# Patient Record
Sex: Male | Born: 2000 | Race: White | Hispanic: Yes | Marital: Single | Smoking: Never smoker
Health system: Southern US, Community
[De-identification: ages and names within clinical notes are randomized; demographics above are authoritative.]

---

## 2019-07-18 ENCOUNTER — Emergency Department (HOSPITAL_COMMUNITY): Payer: BLUE CROSS/BLUE SHIELD

## 2019-07-18 ENCOUNTER — Other Ambulatory Visit: Payer: Self-pay

## 2019-07-18 DIAGNOSIS — R05 Cough: Secondary | ICD-10-CM | POA: Diagnosis not present

## 2019-07-18 DIAGNOSIS — R519 Headache, unspecified: Secondary | ICD-10-CM | POA: Diagnosis not present

## 2019-07-18 DIAGNOSIS — R110 Nausea: Secondary | ICD-10-CM | POA: Insufficient documentation

## 2019-07-18 DIAGNOSIS — Z20828 Contact with and (suspected) exposure to other viral communicable diseases: Secondary | ICD-10-CM | POA: Insufficient documentation

## 2019-07-18 DIAGNOSIS — R079 Chest pain, unspecified: Secondary | ICD-10-CM | POA: Insufficient documentation

## 2019-07-18 DIAGNOSIS — R0602 Shortness of breath: Secondary | ICD-10-CM | POA: Diagnosis not present

## 2019-07-18 DIAGNOSIS — J029 Acute pharyngitis, unspecified: Secondary | ICD-10-CM | POA: Diagnosis not present

## 2019-07-18 DIAGNOSIS — M791 Myalgia, unspecified site: Secondary | ICD-10-CM | POA: Insufficient documentation

## 2019-07-18 DIAGNOSIS — R509 Fever, unspecified: Secondary | ICD-10-CM | POA: Diagnosis not present

## 2019-07-18 MED ORDER — SODIUM CHLORIDE 0.9% FLUSH: 3.0000 mL | Freq: Once | INTRAVENOUS | Status: DC

## 2019-07-19 ENCOUNTER — Emergency Department (HOSPITAL_COMMUNITY)
Admission: EM | Admit: 2019-07-19 | Discharge: 2019-07-19 | Disposition: A | Discharge status: Home / Self Care | Payer: BLUE CROSS/BLUE SHIELD | Attending: Emergency Medicine | Admitting: Emergency Medicine

## 2019-07-19 ENCOUNTER — Encounter (HOSPITAL_COMMUNITY): Payer: Self-pay | Admitting: Emergency Medicine

## 2019-07-19 ENCOUNTER — Other Ambulatory Visit: Payer: Self-pay

## 2019-07-19 DIAGNOSIS — Z20822 Contact with and (suspected) exposure to covid-19: Secondary | ICD-10-CM

## 2019-07-19 LAB — COMPREHENSIVE METABOLIC PANEL
ALT: 31 U/L (ref 0–44)
AST: 25 U/L (ref 15–41)
Albumin: 4.5 g/dL (ref 3.5–5.0)
Alkaline Phosphatase: 43 U/L (ref 38–126)
Anion gap: 11 (ref 5–15)
BUN: 15 mg/dL (ref 6–20)
CO2: 22 mmol/L (ref 22–32)
Calcium: 9.5 mg/dL (ref 8.9–10.3)
Chloride: 103 mmol/L (ref 98–111)
Creatinine, Ser: 1.05 mg/dL (ref 0.61–1.24)
GFR calc Af Amer: >60 mL/min (ref >60)
GFR calc non Af Amer: >60 mL/min (ref >60)
Glucose, Bld: 108 mg/dL — ABNORMAL HIGH (ref 70–99)
Potassium: 3.6 mmol/L (ref 3.5–5.1)
Sodium: 136 mmol/L (ref 135–145)
Total Bilirubin: 1.3 mg/dL — ABNORMAL HIGH (ref 0.3–1.2)
Total Protein: 7.8 g/dL (ref 6.5–8.1)

## 2019-07-19 LAB — URINALYSIS, ROUTINE W REFLEX MICROSCOPIC
Bilirubin Urine: NEGATIVE
Glucose, UA: NEGATIVE mg/dL
Hgb urine dipstick: NEGATIVE
Ketones, ur: NEGATIVE mg/dL
Leukocytes,Ua: NEGATIVE
Nitrite: NEGATIVE
Protein, ur: NEGATIVE mg/dL
Specific Gravity, Urine: 1.019 (ref 1.005–1.030)
pH: 7 (ref 5.0–8.0)

## 2019-07-19 LAB — CBC
HCT: 47.5 % (ref 39.0–52.0)
Hemoglobin: 15.3 g/dL (ref 13.0–17.0)
MCH: 27.5 pg (ref 26.0–34.0)
MCHC: 32.2 g/dL (ref 30.0–36.0)
MCV: 85.3 fL (ref 80.0–100.0)
Platelets: 228 10*3/uL (ref 150–400)
RBC: 5.57 MIL/uL (ref 4.22–5.81)
RDW: 12.5 % (ref 11.5–15.5)
WBC: 9.7 10*3/uL (ref 4.0–10.5)
nRBC: 0 % (ref 0.0–0.2)

## 2019-07-19 LAB — LIPASE, BLOOD: Lipase: 24 U/L (ref 11–51)

## 2019-07-19 LAB — SARS CORONAVIRUS 2 (TAT 6-24 HRS): SARS Coronavirus 2: NEGATIVE

## 2019-07-19 MED ORDER — ACETAMINOPHEN 500 MG PO TABS
1000.0000 mg | ORAL_TABLET | Freq: Once | ORAL | Status: AC
  Administered 2019-07-19: 1000 mg via ORAL
  Filled 2019-07-19: qty 2

## 2019-07-19 NOTE — Discharge Instructions (Addendum)
You may alternate Tylenol 1000 mg every 6 hours as needed for pain and fever and Ibuprofen 800 mg every 8 hours as needed for pain and fever.  Please take Ibuprofen with food.  You may follow-up on your COVID-19 test results through [facility].  Instructions are provided with this paperwork.  If you are positive, you will need to remain in quarantine for at least 10 full days after the onset of symptoms and your symptoms will need to be improving and you will need to be fever free for at least 3 full days without using Tylenol or ibuprofen before coming out of quarantine.  If your test results are positive, any recent close contacts that you have seen in the past 14 days since developing symptoms will need to be notified and will need to quarantine for 14 days themselves.  COVID-19 is a viral illness.  You do not need antibiotics at this time.  Your labs, chest x-ray, urine results today were normal.

## 2019-07-19 NOTE — ED Provider Notes (Signed)
TIME SEEN: 4:02 AM  CHIEF COMPLAINT: Fever, body aches, cough, chest pain, sore throat, headache  HPI: Patient is a 18 year old male with no significant past medical history who presents to the emergency department 1 day of fevers, body aches, dry cough, chest discomfort, sore throat, headache.  No vomiting or diarrhea.  No shortness of breath.  No known Covid exposures.  ROS: See HPI Constitutional:  fever  Eyes: no drainage  ENT: no runny nose   Cardiovascular:   chest pain  Resp: no SOB  GI: no vomiting GU: no dysuria Integumentary: no rash  Allergy: no hives  Musculoskeletal: no leg swelling  Neurological: no slurred speech ROS otherwise negative  PAST MEDICAL HISTORY/PAST SURGICAL HISTORY:  History reviewed. No pertinent past medical history.  MEDICATIONS:  Prior to Admission medications   Not on File    ALLERGIES:  No Known Allergies  SOCIAL HISTORY:  Social History   Tobacco Use  . Smoking status: Never Smoker  . Smokeless tobacco: Never Used  Substance Use Topics  . Alcohol use: Never    Frequency: Never    FAMILY HISTORY: History reviewed. No pertinent family history.  EXAM: BP 129/79 (BP Location: Left Arm)   Pulse 98   Temp (!) 101.9 F (38.8 C) (Oral)   Resp 20   Ht 5\' 9"  (1.753 m)   Wt 99.8 kg   SpO2 100%   BMI 32.49 kg/m  CONSTITUTIONAL: Alert and oriented and responds appropriately to questions. Well-appearing; well-nourished, well-hydrated, nontoxic-appearing HEAD: Normocephalic EYES: Conjunctivae clear, pupils appear equal, EOMI ENT: normal nose; moist mucous membranes; No pharyngeal erythema or petechiae, no tonsillar hypertrophy or exudate, no uvular deviation, no unilateral swelling, no trismus or drooling, no muffled voice, normal phonation, no stridor, no dental caries present, no drainable dental abscess noted, no Ludwig's angina, tongue sits flat in the bottom of the mouth, no angioedema, no facial erythema or warmth, no facial  swelling; no pain with movement of the neck. NECK: Supple, no meningismus, no nuchal rigidity, no LAD  CARD: RRR; S1 and S2 appreciated; no murmurs, no clicks, no rubs, no gallops RESP: Normal chest excursion without splinting or tachypnea; breath sounds clear and equal bilaterally; no wheezes, no rhonchi, no rales, no hypoxia or respiratory distress, speaking full sentences ABD/GI: Normal bowel sounds; non-distended; soft, non-tender, no rebound, no guarding, no peritoneal signs, no hepatosplenomegaly BACK:  The back appears normal and is non-tender to palpation, there is no CVA tenderness EXT: Normal ROM in all joints; non-tender to palpation; no edema; normal capillary refill; no cyanosis, no calf tenderness or swelling    SKIN: Normal color for age and race; warm; no rash NEURO: Moves all extremities equally PSYCH: The patient's mood and manner are appropriate. Grooming and personal hygiene are appropriate.  MEDICAL DECISION MAKING: Patient here with symptoms of viral illness.  Could be possible COVID-19.  He has no increased work of breathing, respiratory distress or hypoxia.  Labs, urine and chest x-ray obtained in triage are unremarkable.  Will provide with Tylenol for headache, body aches and fever.  Doubt meningitis, sepsis, bacteremia.  Will obtain Covid swab today.  Will also obtain EKG given chest discomfort but doubt ACS, PE, dissection.  ED PROGRESS: EKG shows nonspecific T wave changes in inferior leads with no old for comparison.  Doubt ACS.  Chest pain seems related to his viral illness.  I feel he is safe for discharge home.  Discussed supportive care instructions.  Discussed how to check his Covid  test results through MyChart.  Recommend quarantine for 10 days after the onset of symptoms and not to come out of quarantine until he has been fever free for 3 full days.  Recommended he advise any close contacts that he has a febrile illness.  Discussed return precautions.  He is  comfortable with plan for discharge home.   At this time, I do not feel there is any life-threatening condition present. I have reviewed, interpreted and discussed all results (EKG, imaging, lab, urine as appropriate) and exam findings with patient/family. I have reviewed nursing notes and appropriate previous records.  I feel the patient is safe to be discharged home without further emergent workup and can continue workup as an outpatient as needed. Discussed usual and customary return precautions. Patient/family verbalize understanding and are comfortable with this plan.  Outpatient follow-up has been provided as needed. All questions have been answered.    EKG Interpretation  Date/Time:  Tuesday July 19 2019 04:15:45 EST Ventricular Rate:  85 PR Interval:    QRS Duration: 95 QT Interval:  379 QTC Calculation: 451 R Axis:   99 Text Interpretation: Sinus rhythm Borderline right axis deviation Nonspecific repol abnormality, inferior leads No old tracing to compare Confirmed by Derica Leiber, Baxter Hire [phone]) on 07/19/2019 4:43:21 AM        Charlestine Night was evaluated in Emergency Department on 07/19/2019 for the symptoms described in the history of present illness. He was evaluated in the context of the global COVID-19 pandemic, which necessitated consideration that the patient might be at risk for infection with the SARS-CoV-2 virus that causes COVID-19. Institutional protocols and algorithms that pertain to the evaluation of patients at risk for COVID-19 are in a state of rapid change based on information released by regulatory bodies including the CDC and federal and state organizations. These policies and algorithms were followed during the patient's care in the ED.    Evadna Donaghy, Layla Maw, DO 07/19/19 [phone]

## 2021-02-09 IMAGING — CR DG CHEST 2V
2 series · 2 of 2 positions shown · non-contrast
Comparison: None.

CLINICAL DATA: Shortness of breath

EXAM:
CHEST - 2 VIEW

[w chest pa]
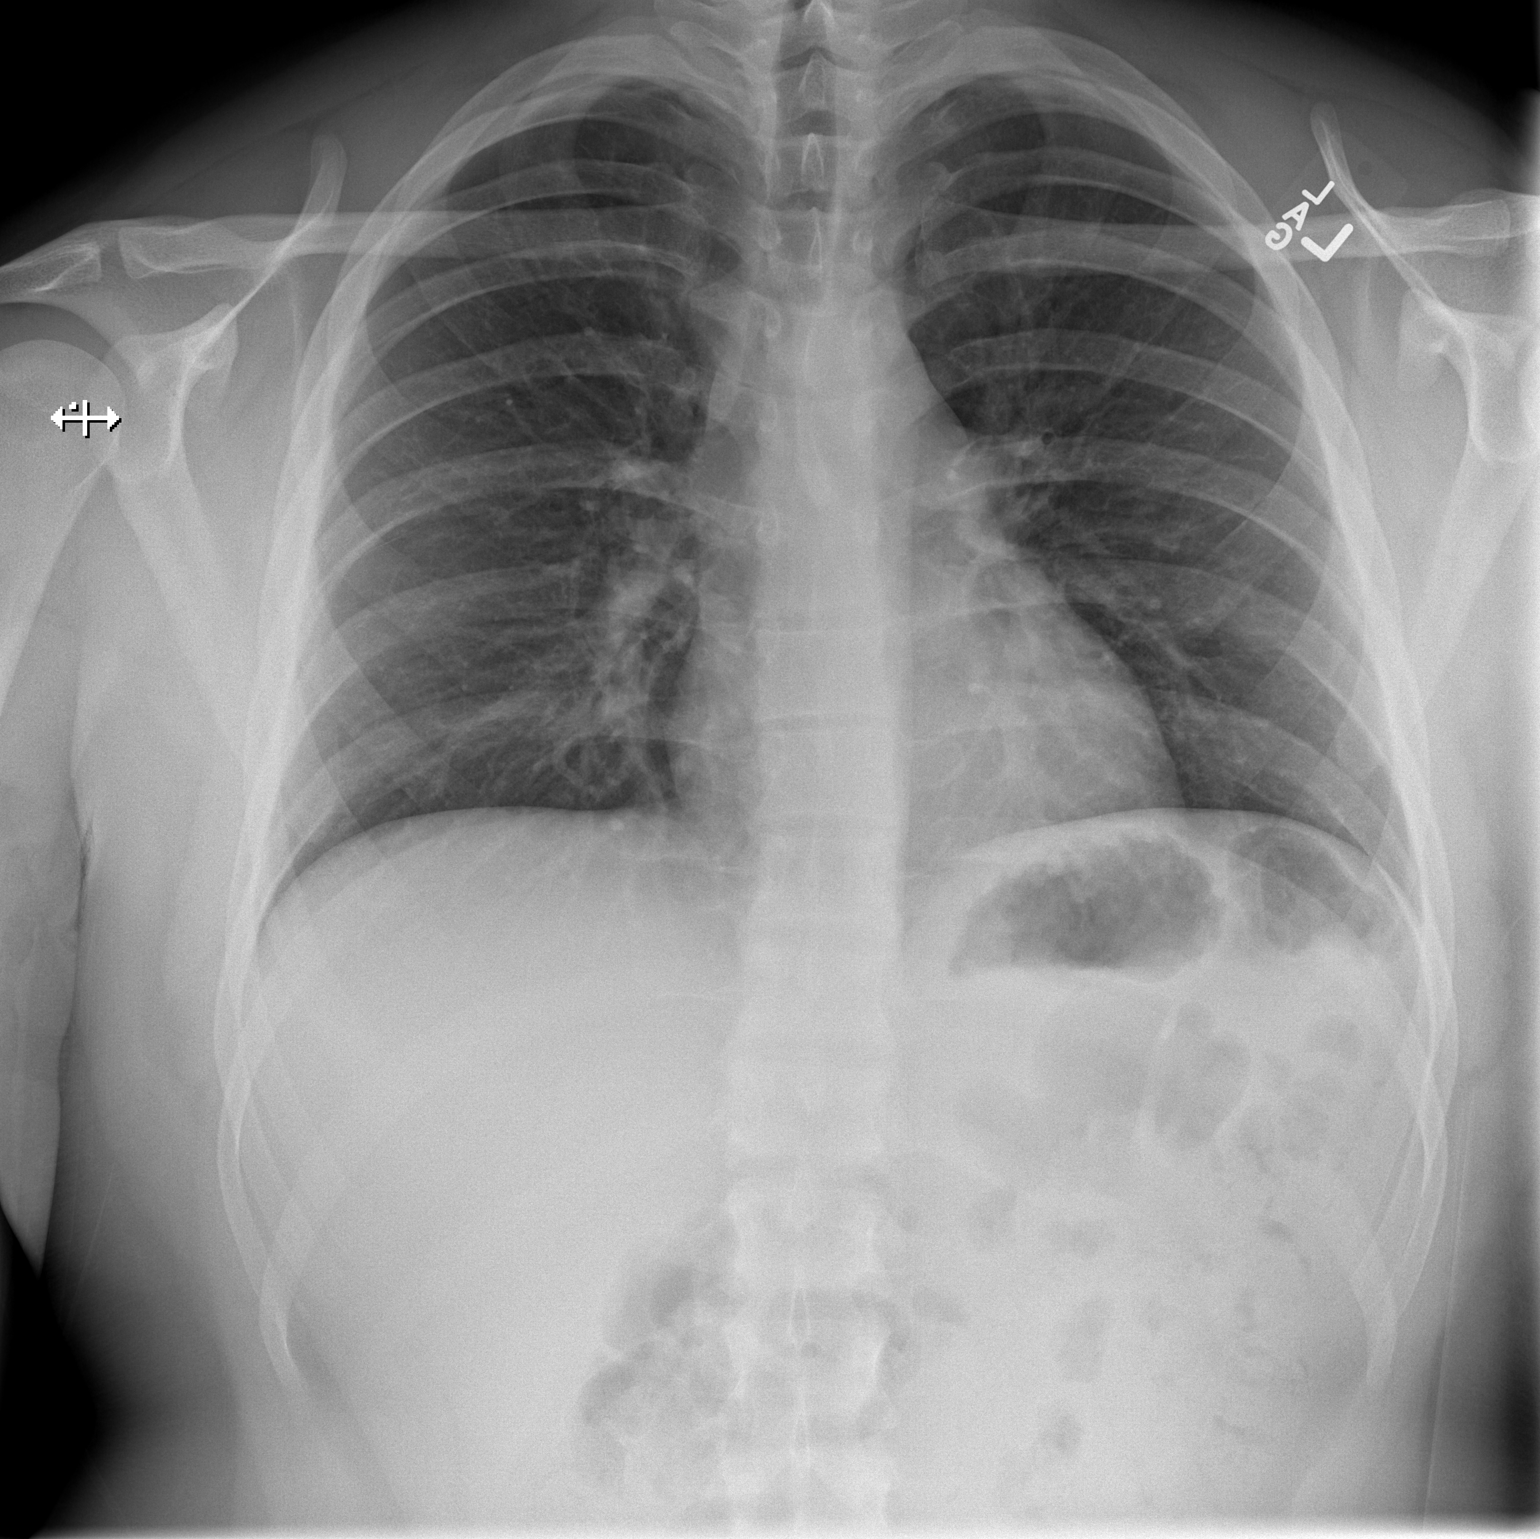

[w chest lat]
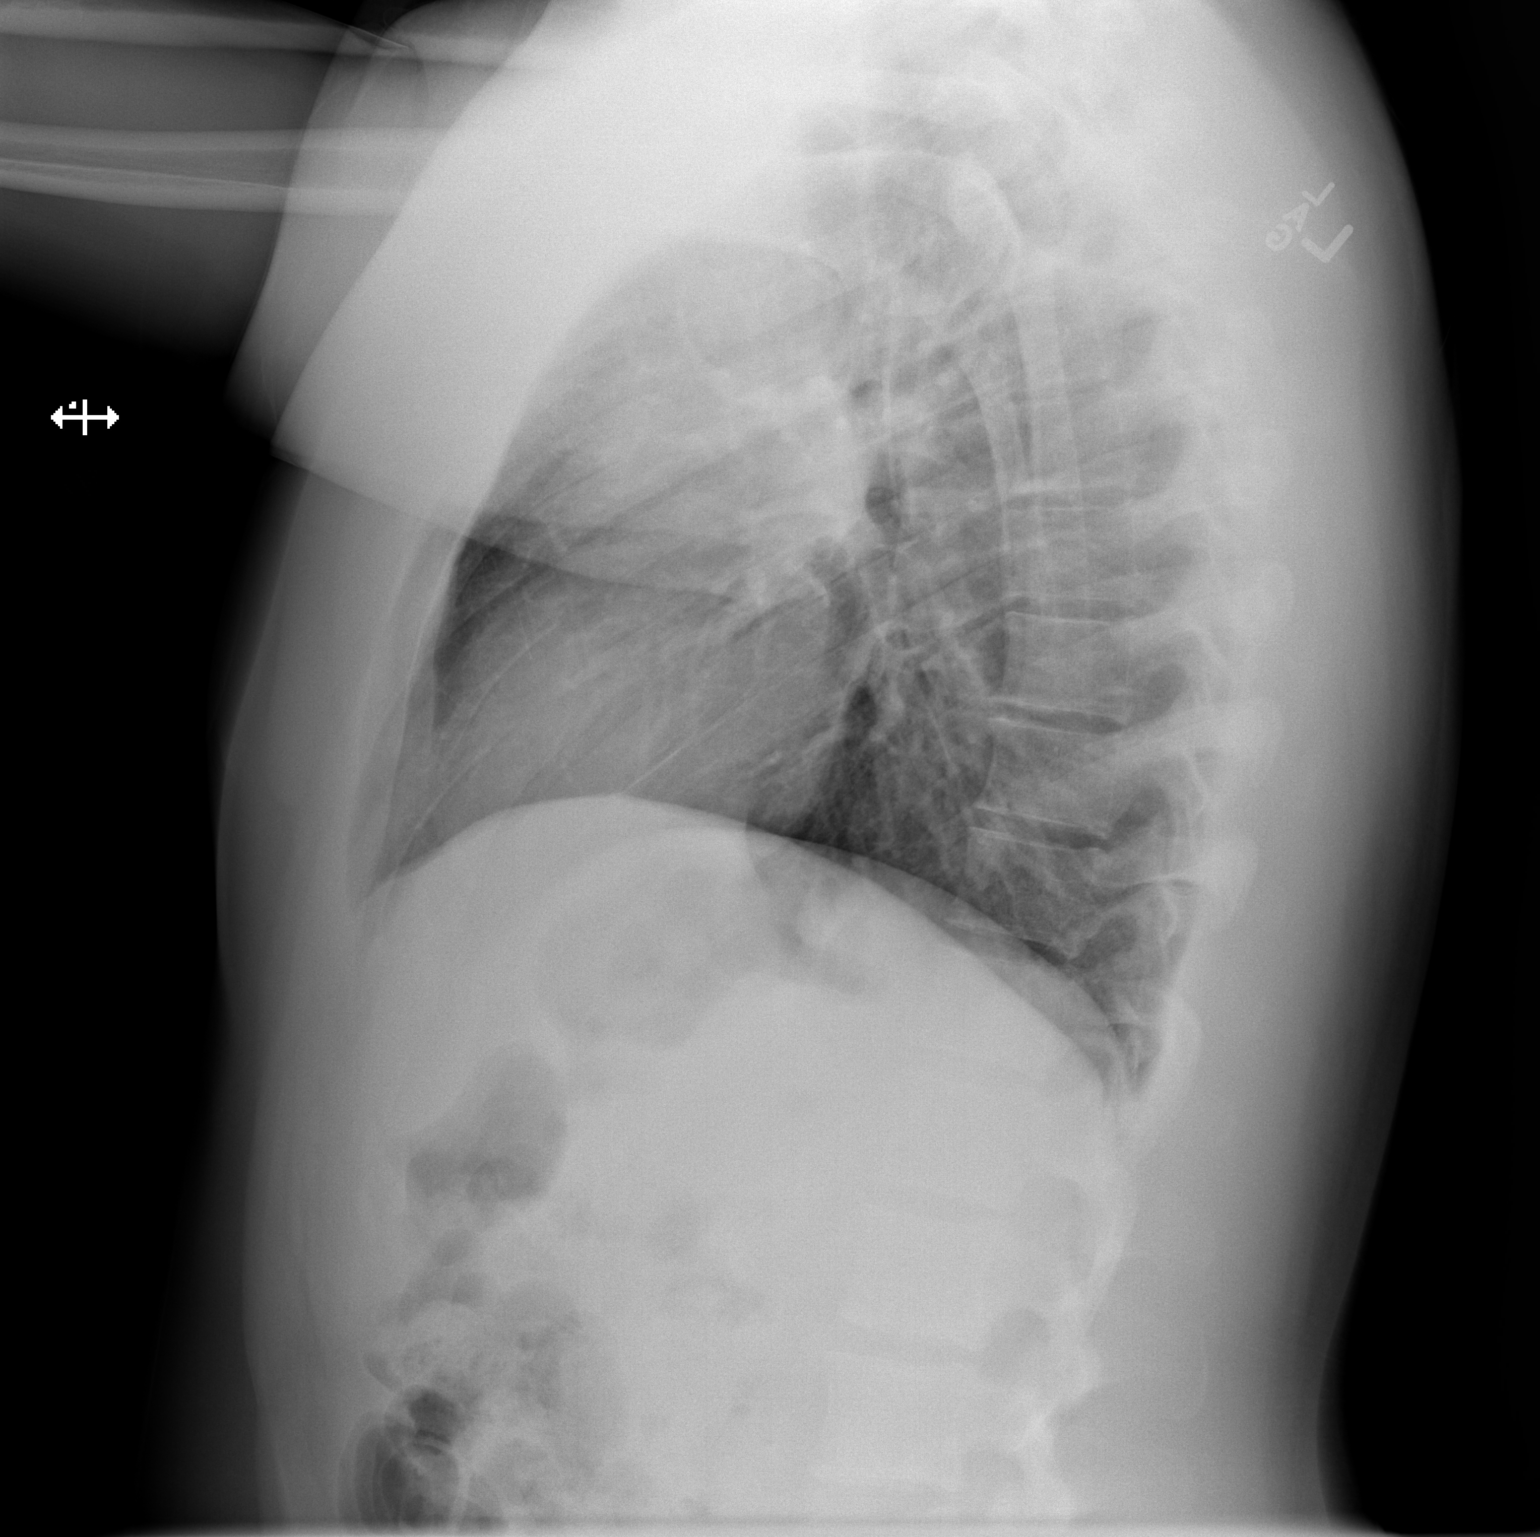

[2 of 2 positions shown; findings below may reference images not displayed]

FINDINGS: The heart size and mediastinal contours are within normal limits.
Both lungs are clear. The visualized skeletal structures are
unremarkable.
IMPRESSION: No active cardiopulmonary disease.

## 2022-06-24 ENCOUNTER — Ambulatory Visit (HOSPITAL_COMMUNITY)
Admission: EM | Admit: 2022-06-24 | Discharge: 2022-06-24 | Disposition: A | Discharge status: Home / Self Care | Payer: BLUE CROSS/BLUE SHIELD

## 2022-06-24 ENCOUNTER — Encounter (HOSPITAL_COMMUNITY): Payer: Self-pay | Admitting: Emergency Medicine

## 2022-06-24 DIAGNOSIS — J069 Acute upper respiratory infection, unspecified: Secondary | ICD-10-CM

## 2022-06-24 NOTE — ED Provider Notes (Addendum)
[facility]    CSN: [id number] Arrival date & time: 06/24/22  1555      History   Chief Complaint Chief Complaint  Patient presents with   Nasal Congestion   Facial Pain    HPI Patrick Mays is a 21 y.o. male.   Patient presents to urgent care for evaluation of nasal congestion, body aches, headache, and generalized fatigue that started abruptly yesterday. Patient was at work when symptoms started and was sent home due to feeling ill. Symptoms have improved greatly since onset yesterday and he would like to go back to work tomorrow, but needs a note from a provider to do so.  COVID-19 testing was negative at home.  He has been using Mucinex, Tylenol, ibuprofen, and NyQuil to help with symptoms.  States that his headache is generalized and currently a 2 on a scale of 0-10.  Denies fever/chills, nausea, vomiting, abdominal pain, dizziness, flank pain, urinary symptoms, ear pain, difficulty swallowing, and known sick contacts.  Patient is not an asthmatic and denies smoking/drug use.  Denies recent antibiotic/steroid use.     History reviewed. No pertinent past medical history.  There are no problems to display for this patient.   History reviewed. No pertinent surgical history.     Home Medications    Prior to Admission medications   Medication Sig Start Date End Date Taking? Authorizing Provider  dextromethorphan-guaiFENesin (MUCINEX DM) 30-600 MG 12hr tablet Take 1 tablet by mouth 2 (two) times daily as needed for cough.    [provider]  ibuprofen (ADVIL) 200 MG tablet Take 400 mg by mouth every 6 (six) hours as needed for headache, mild pain or moderate pain.    [provider]    Family History No family history on file.  Social History Social History   Tobacco Use   Smoking status: Never   Smokeless tobacco: Never  Vaping Use   Vaping Use: Never used  Substance Use Topics   Alcohol use: Never   Drug use: Never      Allergies   Patient has no known allergies.   Review of Systems Review of Systems Per HPI  Physical Exam Triage Vital Signs ED Triage Vitals  Enc Vitals Group     BP 06/24/22 1707 123/80     Pulse Rate 06/24/22 1707 89     Resp 06/24/22 1707 17     Temp 06/24/22 1707 98.2 F (36.8 C)     Temp Source 06/24/22 1707 Oral     SpO2 06/24/22 1707 100 %     Weight --      Height --      Head Circumference --      Peak Flow --      Pain Score 06/24/22 1706 4     Pain Loc --      Pain Edu? --      Excl. in GC? --    No data found.  Updated Vital Signs BP 123/80 (BP Location: Left Arm)   Pulse 89   Temp 98.2 F (36.8 C) (Oral)   Resp 17   SpO2 100%   Visual Acuity Right Eye Distance:   Left Eye Distance:   Bilateral Distance:    Right Eye Near:   Left Eye Near:    Bilateral Near:     Physical Exam Vitals and nursing note reviewed.  Constitutional:      Appearance: He is not ill-appearing or toxic-appearing.  HENT:  Head: Normocephalic and atraumatic.     Right Ear: Hearing, tympanic membrane, ear canal and external ear normal.     Left Ear: Hearing, tympanic membrane, ear canal and external ear normal.     Nose: Congestion present.     Mouth/Throat:     Lips: Pink.     Mouth: Mucous membranes are moist.     Pharynx: No posterior oropharyngeal erythema.  Eyes:     General: Lids are normal. Vision grossly intact. Gaze aligned appropriately.     Extraocular Movements: Extraocular movements intact.     Conjunctiva/sclera: Conjunctivae normal.  Cardiovascular:     Rate and Rhythm: Normal rate and regular rhythm.     Heart sounds: Normal heart sounds, S1 normal and S2 normal.  Pulmonary:     Effort: Pulmonary effort is normal. No respiratory distress.     Breath sounds: Normal breath sounds and air entry.  Musculoskeletal:     Cervical back: Neck supple.  Skin:    General: Skin is warm and dry.     Capillary Refill: Capillary refill takes less than  2 seconds.     Findings: No rash.  Neurological:     General: No focal deficit present.     Mental Status: He is alert and oriented to person, place, and time. Mental status is at baseline.     Cranial Nerves: No dysarthria or facial asymmetry.  Psychiatric:        Mood and Affect: Mood normal.        Speech: Speech normal.        Behavior: Behavior normal.        Thought Content: Thought content normal.        Judgment: Judgment normal.      UC Treatments / Results  Labs (all labs ordered are listed, but only abnormal results are displayed) Labs Reviewed - No data to display  EKG   Radiology No results found.  Procedures Procedures (including critical care time)  Medications Ordered in UC Medications - No data to display  Initial Impression / Assessment and Plan / UC Course  I have reviewed the triage vital signs and the nursing notes.  Pertinent labs & imaging results that were available during my care of the patient were reviewed by me and considered in my medical decision making (see chart for details).   1.  Viral URI with cough Symptoms appear to have improved significantly since onset yesterday.  Patient declines COVID-19 testing, this is okay with me as long as symptoms continue to improve significantly.  Suspicion for COVID-19 is low as patient is clinically well-appearing with hemodynamically stable vital signs and states that he "feels so much better".  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  Advised patient to continue taking medications to help with symptoms as needed.  If symptoms fail to improve or become worse, patient advised to follow-up with urgent care for reevaluation and COVID-19 testing at that time.  Discussed physical exam and available lab work findings in clinic with patient.  Counseled patient regarding appropriate use of medications and potential side effects for all medications recommended or prescribed today. Discussed red flag signs and  symptoms of worsening condition,when to call the PCP office, return to urgent care, and when to seek higher level of care in the emergency department. Patient verbalizes understanding and agreement with plan. All questions answered. Patient discharged in stable condition.    Final Clinical Impressions(s) / UC Diagnoses   Final diagnoses:  Viral URI with cough     Discharge Instructions      Return to urgent care if symptoms worsen.   You may return to work tomorrow.  Continue taking medications to help with symptoms.   I hope you feel better!    ED Prescriptions   None    PDMP not reviewed this encounter.   Talbot Grumbling, [place] 06/24/22 [place], [place], FNP 06/24/22 1824

## 2022-06-24 NOTE — ED Triage Notes (Signed)
Pt reports sinus pain and congestion that started yesterday. Called out of work today and needs a note. Taking Mucinex and Nyquil, tylenol and ibuprofen

## 2022-06-24 NOTE — Discharge Instructions (Signed)
Return to urgent care if symptoms worsen.   You may return to work tomorrow.  Continue taking medications to help with symptoms.   I hope you feel better!

## 2023-11-18 ENCOUNTER — Emergency Department (HOSPITAL_COMMUNITY)
Admission: EM | Admit: 2023-11-18 | Discharge: 2023-11-18 | Disposition: A | Discharge status: Home / Self Care | Payer: Self-pay | Attending: Emergency Medicine | Admitting: Emergency Medicine

## 2023-11-18 ENCOUNTER — Other Ambulatory Visit: Payer: Self-pay

## 2023-11-18 DIAGNOSIS — X58XXXA Exposure to other specified factors, initial encounter: Secondary | ICD-10-CM | POA: Insufficient documentation

## 2023-11-18 DIAGNOSIS — S0502XA Injury of conjunctiva and corneal abrasion without foreign body, left eye, initial encounter: Secondary | ICD-10-CM | POA: Insufficient documentation

## 2023-11-18 MED ORDER — FLUORESCEIN SODIUM 1 MG OP STRP
1.0000 | ORAL_STRIP | Freq: Once | OPHTHALMIC | Status: AC
  Administered 2023-11-18: 1 via OPHTHALMIC
  Filled 2023-11-18: qty 1

## 2023-11-18 MED ORDER — CIPROFLOXACIN HCL 0.3 % OP SOLN
2.0000 [drp] | OPHTHALMIC | Status: DC
  Filled 2023-11-18: qty 2.5

## 2023-11-18 MED ORDER — TETRACAINE HCL 0.5 % OP SOLN
2.0000 [drp] | Freq: Once | OPHTHALMIC | Status: AC
  Administered 2023-11-18: 2 [drp] via OPHTHALMIC
  Filled 2023-11-18: qty 4

## 2023-11-18 MED ORDER — CIPROFLOXACIN HCL 0.3 % OP SOLN
2.0000 [drp] | OPHTHALMIC | Status: DC
  Administered 2023-11-18: 2 [drp] via OPHTHALMIC
  Filled 2023-11-18: qty 2.5

## 2023-11-18 NOTE — ED Provider Notes (Signed)
 [facility] AT [facility] Provider Note   CSN: [id number] Arrival date & time: 11/18/23  1608     History  Chief Complaint  Patient presents with   Eye Pain    Patrick Mays is a 23 y.o. male with no significant past medical history presents with concern for left eye pain that started this morning.  Denies any injuries or trauma to his eye.  Denies any changes in vision.  He reports his eyes have been watering due to the pain.  He is a contact lens user.   Eye Pain       Home Medications Prior to Admission medications   Medication Sig Start Date End Date Taking? Authorizing Provider  dextromethorphan-guaiFENesin (MUCINEX DM) 30-600 MG 12hr tablet Take 1 tablet by mouth 2 (two) times daily as needed for cough.    [provider]  ibuprofen (ADVIL) 200 MG tablet Take 400 mg by mouth every 6 (six) hours as needed for headache, mild pain or moderate pain.    [provider]      Allergies    Patient has no known allergies.    Review of Systems   Review of Systems  Eyes:  Positive for pain.    Physical Exam Updated Vital Signs BP 136/88   Pulse 75   Temp 98.5 F (36.9 C)   Resp 18   Ht 5\' 9"  (1.753 m)   Wt 99.8 kg   SpO2 98%   BMI 32.49 kg/m  Physical Exam Vitals and nursing note reviewed.  Constitutional:      Appearance: Normal appearance.  HENT:     Head: Atraumatic.  Eyes:     Extraocular Movements: Extraocular movements intact.     Pupils: Pupils are equal, round, and reactive to light.     Comments: Conjunctiva erythematous bilaterally from patient rubbing eyes. No purulent drainage. No obvious foreign body  IOP 17 of right eye IOP 25 left eye  Visual acuity was performed without his contact lenses or glasses: 20/200 left eye 20/200 right eye 20/70 both eyes  Corneal abrasion overlying the iris of the left eye.  No Seidel sign.  No corneal abrasions noted in the right eye    Pulmonary:      Effort: Pulmonary effort is normal.  Neurological:     General: No focal deficit present.     Mental Status: He is alert.  Psychiatric:        Mood and Affect: Mood normal.        Behavior: Behavior normal.     ED Results / Procedures / Treatments   Labs (all labs ordered are listed, but only abnormal results are displayed) Labs Reviewed - No data to display  EKG None  Radiology No results found.  Procedures Procedures    Medications Ordered in ED Medications  ciprofloxacin (CILOXAN) 0.3 % ophthalmic solution 2 drop (has no administration in time range)  fluorescein ophthalmic strip 1 strip (1 strip Both Eyes Given by Other 11/18/23 2050)  tetracaine (PONTOCAINE) 0.5 % ophthalmic solution 2 drop (2 drops Both Eyes Given by Other 11/18/23 2050)    ED Course/ Medical Decision Making/ A&P                                 Medical Decision Making Risk Prescription drug management.     Differential diagnosis includes but is not limited to ocular foreign body,  corneal abrasion, glaucoma, conjunctivitis  ED Course:  Patient well-appearing, stable vital signs.  Presenting with concern for left eye pain started this morning.  On exam, conjunctiva slightly erythematous from patient rubbing his eyes.  No purulent discharge from the eyes.  Patient's pain of his left eye improved with application of tetracaine drops.  On fluorescein exam, corneal abrasion of the left eye was noted overlying the patient's iris.  No foreign body noted.  No Seidel sign. No visual changes. Intraocular pressure slightly elevated at 25 in the left eye.  This was discussed with my attending Dr. Silverio Lay who recommended starting patient on ciprofloxacin eyedrops as he does use contact lenses and having him follow-up with his ophthalmologist. Patient given first dose of ciprofloxacin ophthalmic drops here. Discussed the plan with the patient who is in agreement with this plan.  He states he has the ophthalmologist  in [place] he will follow-up with.  Stable appropriate for discharge home  Impression: Corneal abrasion of left eye  Disposition:  The patient was discharged home with instructions to apply 1 to 2 drops of ophthalmic ciprofloxacin ophthalmic drops to the left eye every 4-6 hours while awake for the next 5 days.  Follow-up with ophthalmologist within the next week for recheck of symptoms.  Do not use contact lenses until he is completed the full course of antibiotics. Return precautions given.              Final Clinical Impression(s) / ED Diagnoses Final diagnoses:  Left corneal abrasion, initial encounter    Rx / DC Orders ED Discharge Orders     None         Arabella Merles, PA-C 11/18/23 2128    Charlynne Pander, MD 11/19/23 [phone]

## 2023-11-18 NOTE — Discharge Instructions (Addendum)
 You have an abrasion to the surface of your eye which is likely the cause of your eye pain.  Apply 1-2 drops of the ciprofloxacin eye drop to your left eye every 4-6 hours while awake for the next 5 days. These were provided for you today.   Do not wear your contact lenses for the next 5 days.  You may resume wearing your contact lenses after the full course of antibiotic eye drops.   Please refrain from rubbing your eyes.  Please follow-up with your ophthalmologist in the next 5 to 7 days for a recheck of your symptoms.  Return to the ER for any vision changes, increasing eye pain, any other new or concerning symptoms.

## 2023-11-18 NOTE — ED Triage Notes (Signed)
 Pt states "my right eye was bothering me all day yesterday and I took my contacts out and went to bed and then this morning they're both hurting"

## 2023-11-18 NOTE — ED Notes (Signed)
 Reviewed D/C information with the patient, pt verbalized understanding. No additional concerns at this time.

## 2023-11-18 NOTE — ED Notes (Signed)
 Left eye 20/200  Right eye 20/200  Both eyes 20/70
